# Patient Record
Sex: Female | Born: 1975 | Race: White | Hispanic: No | Marital: Married | Smoking: Never smoker
Health system: Southern US, Community
[De-identification: ages and names within clinical notes are randomized; demographics above are authoritative.]

## PROBLEM LIST (undated history)

## (undated) DIAGNOSIS — F329 Major depressive disorder, single episode, unspecified: Secondary | ICD-10-CM

## (undated) DIAGNOSIS — F32A Depression, unspecified: Secondary | ICD-10-CM

---

## 2013-03-19 ENCOUNTER — Encounter (HOSPITAL_COMMUNITY): Payer: Self-pay | Admitting: Emergency Medicine

## 2013-03-19 ENCOUNTER — Emergency Department (HOSPITAL_COMMUNITY): Payer: BC Managed Care – PPO

## 2013-03-19 ENCOUNTER — Emergency Department (HOSPITAL_COMMUNITY)
Admission: EM | Admit: 2013-03-19 | Discharge: 2013-03-19 | Disposition: A | Discharge status: Home / Self Care | Payer: BC Managed Care – PPO | Attending: Emergency Medicine | Admitting: Emergency Medicine

## 2013-03-19 DIAGNOSIS — S1093XA Contusion of unspecified part of neck, initial encounter: Principal | ICD-10-CM

## 2013-03-19 DIAGNOSIS — S0993XA Unspecified injury of face, initial encounter: Secondary | ICD-10-CM | POA: Insufficient documentation

## 2013-03-19 DIAGNOSIS — F32A Depression, unspecified: Secondary | ICD-10-CM | POA: Insufficient documentation

## 2013-03-19 DIAGNOSIS — W1809XA Striking against other object with subsequent fall, initial encounter: Secondary | ICD-10-CM | POA: Insufficient documentation

## 2013-03-19 DIAGNOSIS — Y929 Unspecified place or not applicable: Secondary | ICD-10-CM | POA: Insufficient documentation

## 2013-03-19 DIAGNOSIS — S199XXA Unspecified injury of neck, initial encounter: Secondary | ICD-10-CM

## 2013-03-19 DIAGNOSIS — W108XXA Fall (on) (from) other stairs and steps, initial encounter: Secondary | ICD-10-CM | POA: Insufficient documentation

## 2013-03-19 DIAGNOSIS — IMO0002 Reserved for concepts with insufficient information to code with codable children: Secondary | ICD-10-CM | POA: Insufficient documentation

## 2013-03-19 DIAGNOSIS — S99929A Unspecified injury of unspecified foot, initial encounter: Secondary | ICD-10-CM

## 2013-03-19 DIAGNOSIS — M542 Cervicalgia: Secondary | ICD-10-CM

## 2013-03-19 DIAGNOSIS — R110 Nausea: Secondary | ICD-10-CM | POA: Insufficient documentation

## 2013-03-19 DIAGNOSIS — S8990XA Unspecified injury of unspecified lower leg, initial encounter: Secondary | ICD-10-CM | POA: Insufficient documentation

## 2013-03-19 DIAGNOSIS — M25561 Pain in right knee: Secondary | ICD-10-CM

## 2013-03-19 DIAGNOSIS — S0083XA Contusion of other part of head, initial encounter: Principal | ICD-10-CM

## 2013-03-19 DIAGNOSIS — W19XXXA Unspecified fall, initial encounter: Secondary | ICD-10-CM

## 2013-03-19 DIAGNOSIS — T148XXA Other injury of unspecified body region, initial encounter: Secondary | ICD-10-CM

## 2013-03-19 DIAGNOSIS — Y9389 Activity, other specified: Secondary | ICD-10-CM | POA: Insufficient documentation

## 2013-03-19 DIAGNOSIS — S0003XA Contusion of scalp, initial encounter: Secondary | ICD-10-CM | POA: Insufficient documentation

## 2013-03-19 DIAGNOSIS — S60229A Contusion of unspecified hand, initial encounter: Secondary | ICD-10-CM | POA: Insufficient documentation

## 2013-03-19 DIAGNOSIS — Z8659 Personal history of other mental and behavioral disorders: Secondary | ICD-10-CM | POA: Insufficient documentation

## 2013-03-19 DIAGNOSIS — S99919A Unspecified injury of unspecified ankle, initial encounter: Secondary | ICD-10-CM

## 2013-03-19 DIAGNOSIS — R269 Unspecified abnormalities of gait and mobility: Secondary | ICD-10-CM | POA: Insufficient documentation

## 2013-03-19 DIAGNOSIS — F329 Major depressive disorder, single episode, unspecified: Secondary | ICD-10-CM | POA: Insufficient documentation

## 2013-03-19 HISTORY — DX: Major depressive disorder, single episode, unspecified: F32.9

## 2013-03-19 HISTORY — DX: Depression, unspecified: F32.A

## 2013-03-19 MED ORDER — ONDANSETRON HCL 4 MG PO TABS: 4.0000 mg | ORAL_TABLET | Freq: Four times a day (QID) | ORAL | Status: AC

## 2013-03-19 MED ORDER — ONDANSETRON 4 MG PO TBDP
8.0000 mg | ORAL_TABLET | Freq: Once | ORAL | Status: AC
  Administered 2013-03-19: 8 mg via ORAL
  Filled 2013-03-19: qty 2

## 2013-03-19 MED ORDER — OXYCODONE-ACETAMINOPHEN 5-325 MG PO TABS
2.0000 | ORAL_TABLET | Freq: Once | ORAL | Status: AC
  Administered 2013-03-19: 2 via ORAL
  Filled 2013-03-19: qty 2

## 2013-03-19 MED ORDER — OXYCODONE-ACETAMINOPHEN 5-325 MG PO TABS: 1.0000 | ORAL_TABLET | Freq: Four times a day (QID) | ORAL | Status: AC | PRN

## 2013-03-19 NOTE — ED Notes (Signed)
Was going down basement steps 7-8  this am and hit head denies  Loc  Or dizziness has a red  Bump on forehead c/o neck and shoulder pain and rt knee pain

## 2013-03-19 NOTE — ED Provider Notes (Signed)
CSN: [id number]     Arrival date & time 03/19/13  4098 History   First MD Initiated Contact with Patient 03/19/13 1003     Chief Complaint  Patient presents with  . Fall   (Consider location/radiation/quality/duration/timing/severity/associated sxs/prior Treatment) HPI Comments: Patient is a 38 year old female who presents after a fall. She reports she was going down the basement steps carrying a blanket in high heels. The high heel got caught in the blanket. This caused her to fall down approximately 6 steps. She hit her head, neck, hands, and knee. She is currently have severe pain in her neck. The pain goes between dull and sharp. The pain is worse with movement and improved with laying still. She has an abrasion to her head, but denies headache, photophobia, visual disturbance. She does has associated nausea. She reports her wrists and hands are sore bilaterally. She has right knee pain. She has been able to ambulate, but requires help from her husband. She was feeling well before the fall with the exception of some nasal congestion. Tetanus shot is UTD within the past 5 years.   The history is provided by the patient. No language interpreter was used.    Past Medical History  Diagnosis Date  . Depression    No past surgical history on file. No family history on file. History  Substance Use Topics  . Smoking status: Never Smoker   . Smokeless tobacco: Not on file  . Alcohol Use: Yes   OB History   Grav Para Term Preterm Abortions TAB SAB Ect Mult Living                 Review of Systems  Constitutional: Negative for fever and chills.  Eyes: Negative for photophobia and visual disturbance.  Respiratory: Negative for shortness of breath.   Cardiovascular: Negative for chest pain.  Gastrointestinal: Positive for nausea. Negative for vomiting and abdominal pain.  Musculoskeletal: Positive for arthralgias, back pain, gait problem, joint swelling, myalgias and neck pain.  Skin:  Positive for wound.  Neurological: Positive for headaches.  All other systems reviewed and are negative.    Allergies  Review of patient's allergies indicates no known allergies.  Home Medications  No current outpatient prescriptions on file. BP 135/80  Pulse 70  Temp(Src) 97.5 F (36.4 C) (Oral)  Resp 18  SpO2 100% Physical Exam  Nursing note and vitals reviewed. Constitutional: She is oriented to person, place, and time. She appears well-developed and well-nourished. No distress.  HENT:  Head: Normocephalic.    Right Ear: External ear normal.  Left Ear: External ear normal.  Nose: Nose normal.  Mouth/Throat: Oropharynx is clear and moist.  5 cm hematoma with abrasion.   Eyes: Conjunctivae, EOM and lids are normal. Pupils are equal, round, and reactive to light.  Neck: Normal range of motion.  Cardiovascular: Normal rate, regular rhythm and normal heart sounds.   Pulmonary/Chest: Effort normal and breath sounds normal. No stridor. No respiratory distress. She has no wheezes. She has no rales.  Abdominal: Soft. She exhibits no distension.  Musculoskeletal: Normal range of motion.  TTP over right wrist diffusely. No snuff box tenderness bilaterally.  Bruising over distal aspect of fingers on left hand.  Neurovascularly intact. Compartment soft.  TTP over right knee. Joint stable. No apprehension sign.   Neurological: She is alert and oriented to person, place, and time. She has normal strength. No sensory deficit. Gait (antalgic) abnormal. Coordination normal.  Grip strength 5/5 bilaterally Finger  nose finger normal  Skin: Skin is warm and dry. She is not diaphoretic. No erythema.  Psychiatric: She has a normal mood and affect. Her behavior is normal.    ED Course  Procedures (including critical care time) Labs Review Labs Reviewed - No data to display Imaging Review Dg Wrist Complete Right  03/19/2013   CLINICAL DATA:  Trauma and pain.  EXAM: RIGHT WRIST - COMPLETE  3+ VIEW  COMPARISON:  None.  FINDINGS: No acute fracture or dislocation. Scaphoid intact.  IMPRESSION: No acute osseous abnormality.   Electronically Signed   By: Jeronimo Greaves M.D.   On: 03/19/2013 11:08   Ct Head Wo Contrast  03/19/2013   CLINICAL DATA:  Trauma.  Numbness 2 right-sided face.  Neck pain.  EXAM: CT HEAD WITHOUT CONTRAST  CT CERVICAL SPINE WITHOUT CONTRAST  TECHNIQUE: Multidetector CT imaging of the head and cervical spine was performed following the standard protocol without intravenous contrast. Multiplanar CT image reconstructions of the cervical spine were also gen Prevertebral soft tissues are within normal limits. No apical pneumothorax.  COMPARISON:  None.  FINDINGS: CT HEAD FINDINGS  Sinuses/Soft tissues: Right frontal scalp soft tissue swelling on image 20/series 3.  Fluid level in the right maxillary sinus. No underlying skull fracture. Clear mastoid air cells.  Intracranial: No mass lesion, hemorrhage, hydrocephalus, acute infarct, intra-axial, or extra-axial fluid collection.  CT CERVICAL SPINE FINDINGS  Spinal visualization through the bottom of C7 to mid T1 level. From C7 inferiorly are degraded secondary to overlying soft tissues. Prevertebral soft tissues are within normal limits. No apical pneumothorax. Maintenance of vertebral body height. Facets are well-aligned. Straightening of expected lordosis. Coronal reformats demonstrate a normal C1-C2 articulation.  IMPRESSION: CT HEAD IMPRESSION  1. Right frontal scalp soft tissue swelling. 2.  No acute intracranial abnormality. 3. Right maxillary sinus fluid. Presumably related to sinusitis. If there was facial trauma consider dedicated face CT to exclude fracture.  CT CERVICAL SPINE IMPRESSION  1. No acute fracture or subluxation. 2. Straightening of expected cervical lordosis could be positional, due to muscular spasm, or ligamentous injury.   Electronically Signed   By: Jeronimo Greaves M.D.   On: 03/19/2013 11:28   Ct Cervical Spine Wo  Contrast  03/19/2013   CLINICAL DATA:  Trauma.  Numbness 2 right-sided face.  Neck pain.  EXAM: CT HEAD WITHOUT CONTRAST  CT CERVICAL SPINE WITHOUT CONTRAST  TECHNIQUE: Multidetector CT imaging of the head and cervical spine was performed following the standard protocol without intravenous contrast. Multiplanar CT image reconstructions of the cervical spine were also gen Prevertebral soft tissues are within normal limits. No apical pneumothorax.  COMPARISON:  None.  FINDINGS: CT HEAD FINDINGS  Sinuses/Soft tissues: Right frontal scalp soft tissue swelling on image 20/series 3.  Fluid level in the right maxillary sinus. No underlying skull fracture. Clear mastoid air cells.  Intracranial: No mass lesion, hemorrhage, hydrocephalus, acute infarct, intra-axial, or extra-axial fluid collection.  CT CERVICAL SPINE FINDINGS  Spinal visualization through the bottom of C7 to mid T1 level. From C7 inferiorly are degraded secondary to overlying soft tissues. Prevertebral soft tissues are within normal limits. No apical pneumothorax. Maintenance of vertebral body height. Facets are well-aligned. Straightening of expected lordosis. Coronal reformats demonstrate a normal C1-C2 articulation.  IMPRESSION: CT HEAD IMPRESSION  1. Right frontal scalp soft tissue swelling. 2.  No acute intracranial abnormality. 3. Right maxillary sinus fluid. Presumably related to sinusitis. If there was facial trauma consider dedicated face CT to exclude  fracture.  CT CERVICAL SPINE IMPRESSION  1. No acute fracture or subluxation. 2. Straightening of expected cervical lordosis could be positional, due to muscular spasm, or ligamentous injury.   Electronically Signed   By: Jeronimo GreavesKyle  Talbot M.D.   On: 03/19/2013 11:28   Dg Knee Complete 4 Views Right  03/19/2013   CLINICAL DATA:  Fall down steps this morning with anterior pain.  EXAM: RIGHT KNEE - COMPLETE 4+ VIEW  COMPARISON:  None.  FINDINGS: No acute fracture or dislocation. No definite joint effusion.  Joint spaces maintained.  IMPRESSION: No acute osseous abnormality.   Electronically Signed   By: Jeronimo GreavesKyle  Talbot M.D.   On: 03/19/2013 11:12   Dg Hand Complete Left  03/19/2013   CLINICAL DATA:  Fall with wrist and palm pain.  EXAM: LEFT HAND - COMPLETE 3+ VIEW  COMPARISON:  None.  FINDINGS: No acute fracture or dislocation.  No definite soft tissue swelling.  IMPRESSION: No acute osseous abnormality.   Electronically Signed   By: Jeronimo GreavesKyle  Talbot M.D.   On: 03/19/2013 11:06    EKG Interpretation   None       MDM   1. Fall, initial encounter   2. Contusion   3. Right knee pain   4. Neck pain    Patient presents to ED after a fall down stairs. Patient is feeling improved after percocet in ED. All imaging shows no acute fracture or pathology. I discussed with patient it is possible she has a concussion and the dangers of re injuring her head. She understands to return to activity in a step wise pattern. Neuro exam is WNL. She was given crutches and a knee immobilizer for comfort. Compartment soft. Neurovascularly intact. I gave the patient strict return instructions. Vital signs stable for discharge. Patient / Family / Caregiver informed of clinical course, understand medical decision-making process, and agree with plan.  Mora BellmanHannah S Quinley Nesler, PA-C 03/19/13 1344

## 2013-03-19 NOTE — ED Notes (Signed)
Patient transported to X-ray 

## 2013-03-19 NOTE — Discharge Instructions (Signed)
Abrasion An abrasion is a cut or scrape of the skin. Abrasions do not extend through all layers of the skin and most heal within 10 days. It is important to care for your abrasion properly to prevent infection. CAUSES  Most abrasions are caused by falling on, or gliding across, the ground or other surface. When your skin rubs on something, the outer and inner layer of skin rubs off, causing an abrasion. DIAGNOSIS  Your caregiver will be able to diagnose an abrasion during a physical exam.  TREATMENT  Your treatment depends on how large and deep the abrasion is. Generally, your abrasion will be cleaned with water and a mild soap to remove any dirt or debris. An antibiotic ointment may be put over the abrasion to prevent an infection. A bandage (dressing) may be wrapped around the abrasion to keep it from getting dirty.  You may need a tetanus shot if:  You cannot remember when you had your last tetanus shot.  You have never had a tetanus shot.  The injury broke your skin. If you get a tetanus shot, your arm may swell, get red, and feel warm to the touch. This is common and not a problem. If you need a tetanus shot and you choose not to have one, there is a rare chance of getting tetanus. Sickness from tetanus can be serious.  HOME CARE INSTRUCTIONS   If a dressing was applied, change it at least once a day or as directed by your caregiver. If the bandage sticks, soak it off with warm water.   Wash the area with water and a mild soap to remove all the ointment 2 times a day. Rinse off the soap and pat the area dry with a clean towel.   Reapply any ointment as directed by your caregiver. This will help prevent infection and keep the bandage from sticking. Use gauze over the wound and under the dressing to help keep the bandage from sticking.   Change your dressing right away if it becomes wet or dirty.   Only take over-the-counter or prescription medicines for pain, discomfort, or fever as  directed by your caregiver.   Follow up with your caregiver within 24 48 hours for a wound check, or as directed. If you were not given a wound-check appointment, look closely at your abrasion for redness, swelling, or pus. These are signs of infection. SEEK IMMEDIATE MEDICAL CARE IF:   You have increasing pain in the wound.   You have redness, swelling, or tenderness around the wound.   You have pus coming from the wound.   You have a fever or persistent symptoms for more than 2 3 days.  You have a fever and your symptoms suddenly get worse.  You have a bad smell coming from the wound or dressing.  MAKE SURE YOU:   Understand these instructions.  Will watch your condition.  Will get help right away if you are not doing well or get worse. Document Released: 12/06/2004 Document Revised: 02/13/2012 Document Reviewed: 01/30/2011 [facility] Patient Information 2014 [place], [place].  Arthralgia Arthralgia is joint pain. A joint is a place where two bones meet. Joint pain can happen for many reasons. The joint can be bruised, stiff, infected, or weak from aging. Pain usually goes away after resting and taking medicine for soreness.  HOME CARE  Rest the joint as told by your doctor.  Keep the sore joint raised (elevated) for the first 24 hours.  Put ice on the  joint area.  Put ice in a plastic bag.  Place a towel between your skin and the bag.  Leave the ice on for 15-20 minutes, 03-04 times a day.  Wear your splint, casting, elastic bandage, or sling as told by your doctor.  Only take medicine as told by your doctor. Do not take aspirin.  Use crutches as told by your doctor. Do not put weight on the joint until told to by your doctor. GET HELP RIGHT AWAY IF:   You have bruising, puffiness (swelling), or more pain.  Your fingers or toes turn blue or start to lose feeling (numb).  Your medicine does not lessen the pain.  Your pain becomes severe.  You have a  temperature by mouth above 102 F (38.9 C), not controlled by medicine.  You cannot move or use the joint. MAKE SURE YOU:   Understand these instructions.  Will watch your condition.  Will get help right away if you are not doing well or get worse. Document Released: 02/14/2009 Document Revised: 05/21/2011 Document Reviewed: 02/14/2009 [facility] Patient Information 2014 [place], [place].  Cervical Strain and Sprain (Whiplash) with Rehab Cervical strain and sprains are injuries that commonly occur with "whiplash" injuries. Whiplash occurs when the neck is forcefully whipped backward or forward, such as during a motor vehicle accident. The muscles, ligaments, tendons, discs and nerves of the neck are susceptible to injury when this occurs. SYMPTOMS   Pain or stiffness in the front and/or back of neck  Symptoms may present immediately or up to 24 hours after injury.  Dizziness, headache, nausea and vomiting.  Muscle spasm with soreness and stiffness in the neck.  Tenderness and swelling at the injury site. CAUSES  Whiplash injuries often occur during contact sports or motor vehicle accidents.  RISK INCREASES WITH:  Osteoarthritis of the spine.  Situations that make head or neck accidents or trauma more likely.  High-risk sports (football, rugby, wrestling, hockey, auto racing, gymnastics, diving, contact karate or boxing).  Poor strength and flexibility of the neck.  Previous neck injury.  Poor tackling technique.  Improperly fitted or padded equipment. PREVENTION  Learn and use proper technique (avoid tackling with the head, spearing and head-butting; use proper falling techniques to avoid landing on the head).  Warm up and stretch properly before activity.  Maintain physical fitness:  Strength, flexibility and endurance.  Cardiovascular fitness.  Wear properly fitted and padded protective equipment, such as padded soft collars, for participation in contact  sports. PROGNOSIS  Recovery for cervical strain and sprain injuries is dependent on the extent of the injury. These injuries are usually curable in 1 week to 3 months with appropriate treatment.  RELATED COMPLICATIONS   Temporary numbness and weakness may occur if the nerve roots are damaged, and this may persist until the nerve has completely healed.  Chronic pain due to frequent recurrence of symptoms.  Prolonged healing, especially if activity is resumed too soon (before complete recovery). TREATMENT  Treatment initially involves the use of ice and medication to help reduce pain and inflammation. It is also important to perform strengthening and stretching exercises and modify activities that worsen symptoms so the injury does not get worse. These exercises may be performed at home or with a therapist. For patients who experience severe symptoms, a soft padded collar may be recommended to be worn around the neck.  Improving your posture may help reduce symptoms. Posture improvement includes pulling your chin and abdomen in while sitting or standing. If you  are sitting, sit in a firm chair with your buttocks against the back of the chair. While sleeping, try replacing your pillow with a small towel rolled to 2 inches in diameter, or use a cervical pillow or soft cervical collar. Poor sleeping positions delay healing.  For patients with nerve root damage, which causes numbness or weakness, the use of a cervical traction apparatus may be recommended. Surgery is rarely necessary for these injuries. However, cervical strain and sprains that are present at birth (congenital) may require surgery. MEDICATION   If pain medication is necessary, nonsteroidal anti-inflammatory medications, such as aspirin and ibuprofen, or other minor pain relievers, such as acetaminophen, are often recommended.  Do not take pain medication for 7 days before surgery.  Prescription pain relievers may be given if deemed  necessary by your caregiver. Use only as directed and only as much as you need. HEAT AND COLD:   Cold treatment (icing) relieves pain and reduces inflammation. Cold treatment should be applied for 10 to 15 minutes every 2 to 3 hours for inflammation and pain and immediately after any activity that aggravates your symptoms. Use ice packs or an ice massage.  Heat treatment may be used prior to performing the stretching and strengthening activities prescribed by your caregiver, physical therapist, or athletic trainer. Use a heat pack or a warm soak. SEEK MEDICAL CARE IF:   Symptoms get worse or do not improve in 2 weeks despite treatment.  New, unexplained symptoms develop (drugs used in treatment may produce side effects). EXERCISES RANGE OF MOTION (ROM) AND STRETCHING EXERCISES - Cervical Strain and Sprain These exercises may help you when beginning to rehabilitate your injury. In order to successfully resolve your symptoms, you must improve your posture. These exercises are designed to help reduce the forward-head and rounded-shoulder posture which contributes to this condition. Your symptoms may resolve with or without further involvement from your physician, physical therapist or athletic trainer. While completing these exercises, remember:   Restoring tissue flexibility helps normal motion to return to the joints. This allows healthier, less painful movement and activity.  An effective stretch should be held for at least 20 seconds, although you may need to begin with shorter hold times for comfort.  A stretch should never be painful. You should only feel a gentle lengthening or release in the stretched tissue. STRETCH- Axial Extensors  Lie on your back on the floor. You may bend your knees for comfort. Place a rolled up hand towel or dish towel, about 2 inches in diameter, under the part of your head that makes contact with the floor.  Gently tuck your chin, as if trying to make a "double  chin," until you feel a gentle stretch at the base of your head.  Hold __________ seconds. Repeat __________ times. Complete this exercise __________ times per day.  STRETECH - Axial Extension   Stand or sit on a firm surface. Assume a good posture: chest up, shoulders drawn back, abdominal muscles slightly tense, knees unlocked (if standing) and feet hip width apart.  Slowly retract your chin so your head slides back and your chin slightly lowers.Continue to look straight ahead.  You should feel a gentle stretch in the back of your head. Be certain not to feel an aggressive stretch since this can cause headaches later.  Hold for __________ seconds. Repeat __________ times. Complete this exercise __________ times per day. STRETCH  Cervical Side Bend   Stand or sit on a firm surface. Assume a good  posture: chest up, shoulders drawn back, abdominal muscles slightly tense, knees unlocked (if standing) and feet hip width apart.  Without letting your nose or shoulders move, slowly tip your right / left ear to your shoulder until your feel a gentle stretch in the muscles on the opposite side of your neck.  Hold __________ seconds. Repeat __________ times. Complete this exercise __________ times per day. STRETCH  Cervical Rotators   Stand or sit on a firm surface. Assume a good posture: chest up, shoulders drawn back, abdominal muscles slightly tense, knees unlocked (if standing) and feet hip width apart.  Keeping your eyes level with the ground, slowly turn your head until you feel a gentle stretch along the back and opposite side of your neck.  Hold __________ seconds. Repeat __________ times. Complete this exercise __________ times per day. RANGE OF MOTION - Neck Circles   Stand or sit on a firm surface. Assume a good posture: chest up, shoulders drawn back, abdominal muscles slightly tense, knees unlocked (if standing) and feet hip width apart.  Gently roll your head down and around  from the back of one shoulder to the back of the other. The motion should never be forced or painful.  Repeat the motion 10-20 times, or until you feel the neck muscles relax and loosen. Repeat __________ times. Complete the exercise __________ times per day. STRENGTHENING EXERCISES - Cervical Strain and Sprain These exercises may help you when beginning to rehabilitate your injury. They may resolve your symptoms with or without further involvement from your physician, physical therapist or athletic trainer. While completing these exercises, remember:   Muscles can gain both the endurance and the strength needed for everyday activities through controlled exercises.  Complete these exercises as instructed by your physician, physical therapist or athletic trainer. Progress the resistance and repetitions only as guided.  You may experience muscle soreness or fatigue, but the pain or discomfort you are trying to eliminate should never worsen during these exercises. If this pain does worsen, stop and make certain you are following the directions exactly. If the pain is still present after adjustments, discontinue the exercise until you can discuss the trouble with your clinician. STRENGTH Cervical Flexors, Isometric  Face a wall, standing about 6 inches away. Place a small pillow, a ball about 6-8 inches in diameter, or a folded towel between your forehead and the wall.  Slightly tuck your chin and gently push your forehead into the soft object. Push only with mild to moderate intensity, building up tension gradually. Keep your jaw and forehead relaxed.  Hold 10 to 20 seconds. Keep your breathing relaxed.  Release the tension slowly. Relax your neck muscles completely before you start the next repetition. Repeat __________ times. Complete this exercise __________ times per day. STRENGTH- Cervical Lateral Flexors, Isometric   Stand about 6 inches away from a wall. Place a small pillow, a ball about  6-8 inches in diameter, or a folded towel between the side of your head and the wall.  Slightly tuck your chin and gently tilt your head into the soft object. Push only with mild to moderate intensity, building up tension gradually. Keep your jaw and forehead relaxed.  Hold 10 to 20 seconds. Keep your breathing relaxed.  Release the tension slowly. Relax your neck muscles completely before you start the next repetition. Repeat __________ times. Complete this exercise __________ times per day. STRENGTH  Cervical Extensors, Isometric   Stand about 6 inches away from a wall. Place a  small pillow, a ball about 6-8 inches in diameter, or a folded towel between the back of your head and the wall.  Slightly tuck your chin and gently tilt your head back into the soft object. Push only with mild to moderate intensity, building up tension gradually. Keep your jaw and forehead relaxed.  Hold 10 to 20 seconds. Keep your breathing relaxed.  Release the tension slowly. Relax your neck muscles completely before you start the next repetition. Repeat __________ times. Complete this exercise __________ times per day. POSTURE AND BODY MECHANICS CONSIDERATIONS - Cervical Strain and Sprain Keeping correct posture when sitting, standing or completing your activities will reduce the stress put on different body tissues, allowing injured tissues a chance to heal and limiting painful experiences. The following are general guidelines for improved posture. Your physician or physical therapist will provide you with any instructions specific to your needs. While reading these guidelines, remember:  The exercises prescribed by your provider will help you have the flexibility and strength to maintain correct postures.  The correct posture provides the optimal environment for your joints to work. All of your joints have less wear and tear when properly supported by a spine with good posture. This means you will experience a  healthier, less painful body.  Correct posture must be practiced with all of your activities, especially prolonged sitting and standing. Correct posture is as important when doing repetitive low-stress activities (typing) as it is when doing a single heavy-load activity (lifting). PROLONGED STANDING WHILE SLIGHTLY LEANING FORWARD When completing a task that requires you to lean forward while standing in one place for a long time, place either foot up on a stationary 2-4 inch high object to help maintain the best posture. When both feet are on the ground, the low back tends to lose its slight inward curve. If this curve flattens (or becomes too large), then the back and your other joints will experience too much stress, fatigue more quickly and can cause pain.  RESTING POSITIONS Consider which positions are most painful for you when choosing a resting position. If you have pain with flexion-based activities (sitting, bending, stooping, squatting), choose a position that allows you to rest in a less flexed posture. You would want to avoid curling into a fetal position on your side. If your pain worsens with extension-based activities (prolonged standing, working overhead), avoid resting in an extended position such as sleeping on your stomach. Most people will find more comfort when they rest with their spine in a more neutral position, neither too rounded nor too arched. Lying on a non-sagging bed on your side with a pillow between your knees, or on your back with a pillow under your knees will often provide some relief. Keep in mind, being in any one position for a prolonged period of time, no matter how correct your posture, can still lead to stiffness. WALKING Walk with an upright posture. Your ears, shoulders and hips should all line-up. OFFICE WORK When working at a desk, create an environment that supports good, upright posture. Without extra support, muscles fatigue and lead to excessive strain on  joints and other tissues. CHAIR:  A chair should be able to slide under your desk when your back makes contact with the back of the chair. This allows you to work closely.  The chair's height should allow your eyes to be level with the upper part of your monitor and your hands to be slightly lower than your elbows.  Body position:  Your feet should make contact with the floor. If this is not possible, use a foot rest.  Keep your ears over your shoulders. This will reduce stress on your neck and low back. Document Released: 02/26/2005 Document Revised: 06/23/2012 Document Reviewed: 06/10/2008 [facility] Patient Information 2014 [place], [place].

## 2013-03-20 NOTE — ED Provider Notes (Signed)
Medical screening examination/treatment/procedure(s) were performed by non-physician practitioner and as supervising physician I was immediately available for consultation/collaboration.  EKG Interpretation   None          Candiss Galeana E Deshanti Adcox, MD 03/20/13 1014 

## 2014-06-10 IMAGING — CT CT HEAD W/O CM
3 of 5 series · 15 of 47 positions shown, 18 images · non-contrast
Comparison: None.

CLINICAL DATA: Trauma.  Numbness 2 right-sided face.  Neck pain.

EXAM:
CT HEAD WITHOUT CONTRAST
CT CERVICAL SPINE WITHOUT CONTRAST
TECHNIQUE: Multidetector CT imaging of the head and cervical spine was
performed following the standard protocol without intravenous
contrast. Multiplanar CT image reconstructions of the cervical spine
were also gen Prevertebral soft tissues are within normal limits. No
apical pneumothorax.

[Series 7: coronals · coronal · 0.26mm/px · 3 of 39 slices shown]
[im 13/39  brain]
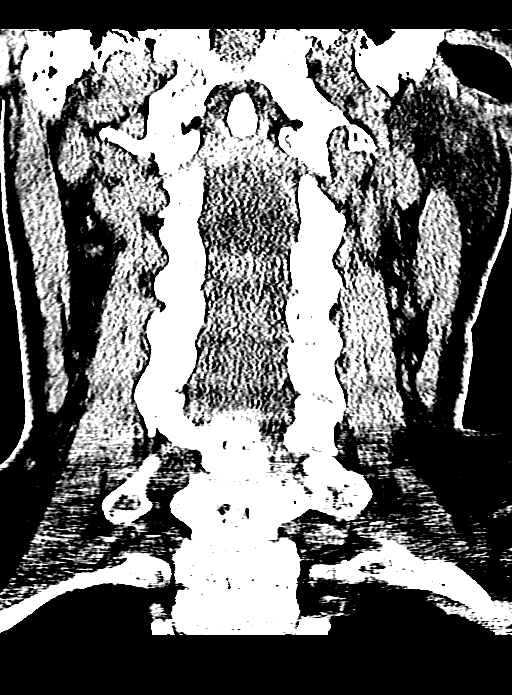
[im 17/39  brain]
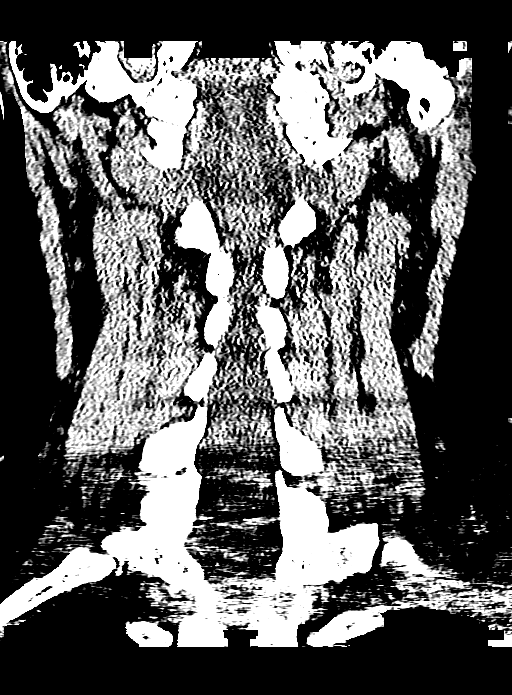
[im 22/39  brain]
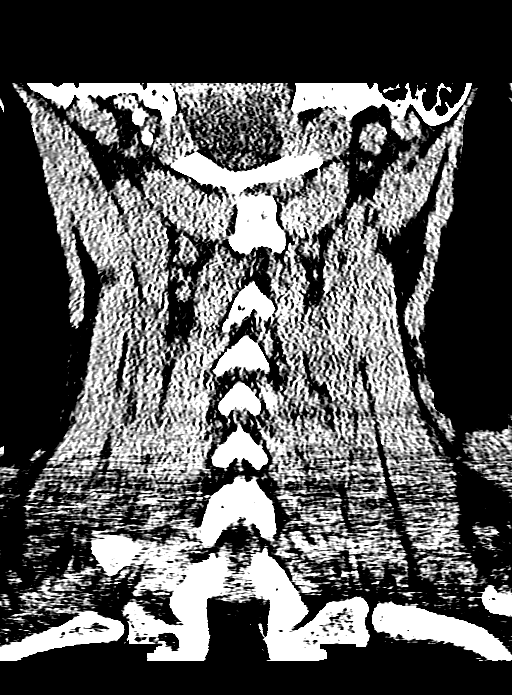

[Series 8: sagittals · sagittal · 0.30mm/px · 3 of 39 slices shown]
[im 13/39  brain]
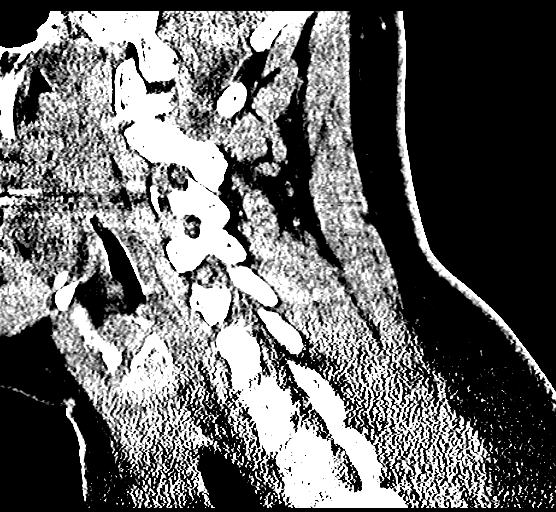
[im 20/39  brain]
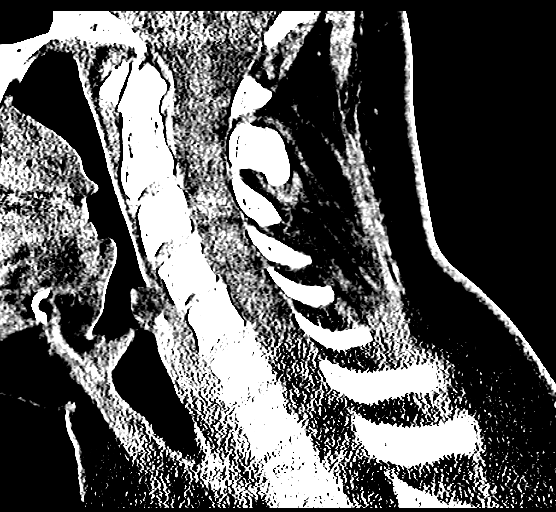
[im 26/39  brain]
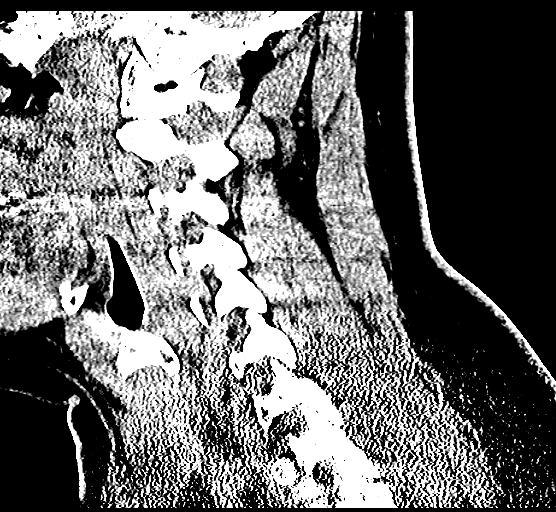

[Series 9: orthogonals · axial · 0.32mm/px · z∈[-339,-202]mm · 9 of 90 slices shown, 12 images]
[im 7/90  brain]
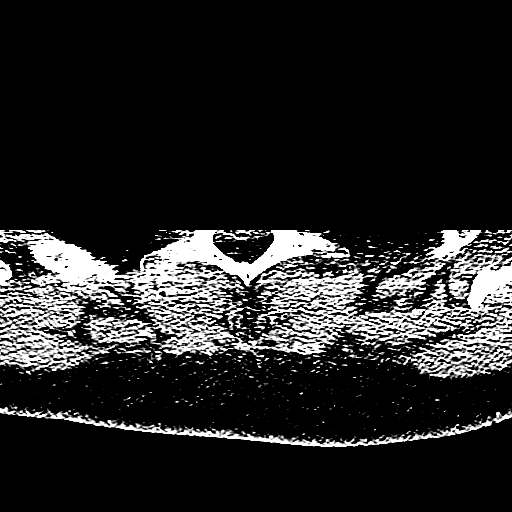
[im 7/90  bone]
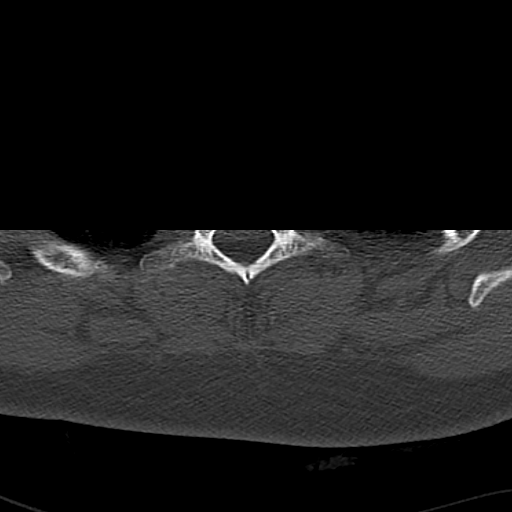
[im 21/90  brain]
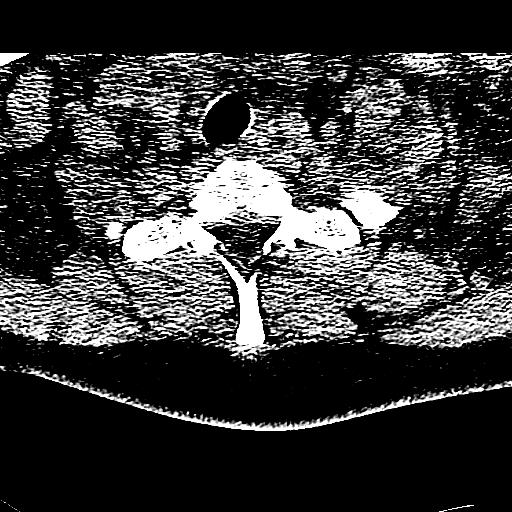
[im 28/90  brain]
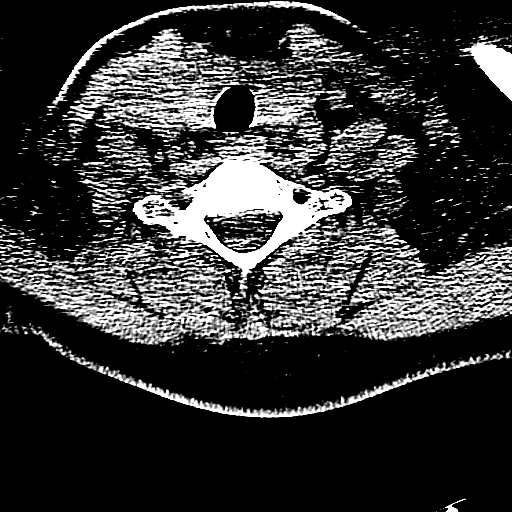
[im 35/90  brain]
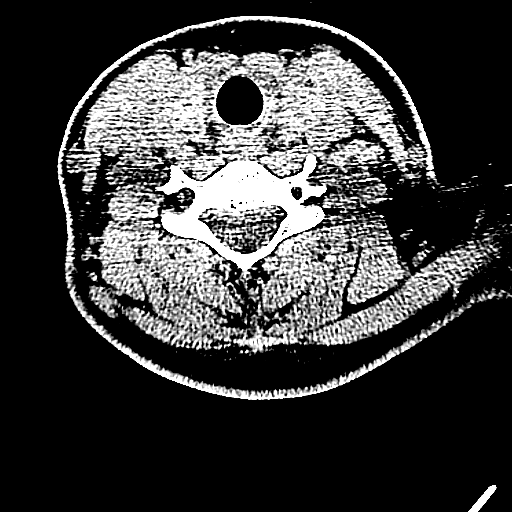
[im 48/90  brain]
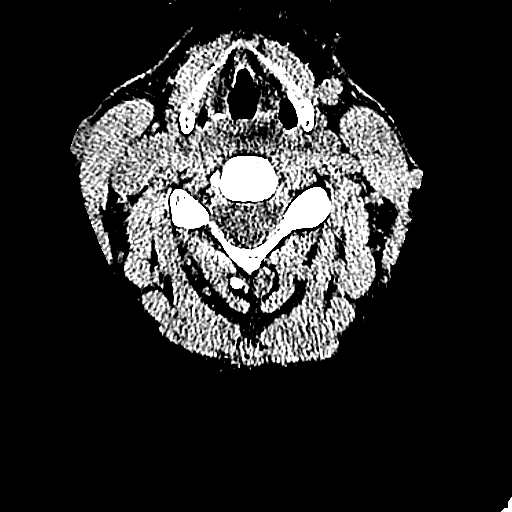
[im 48/90  bone]
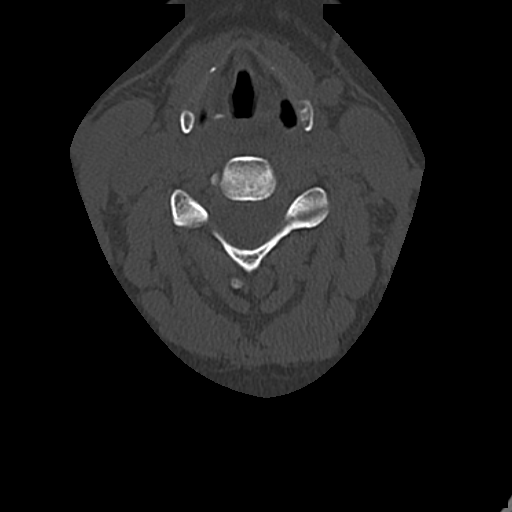
[im 55/90  brain]
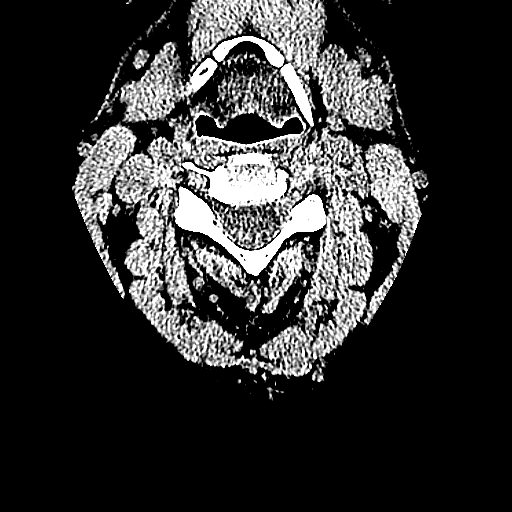
[im 62/90  brain]
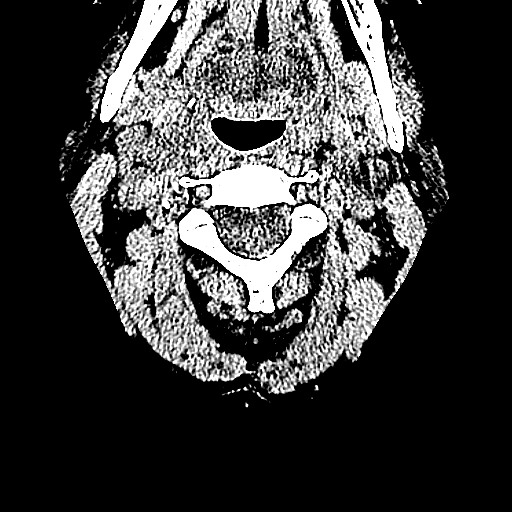
[im 76/90  brain]
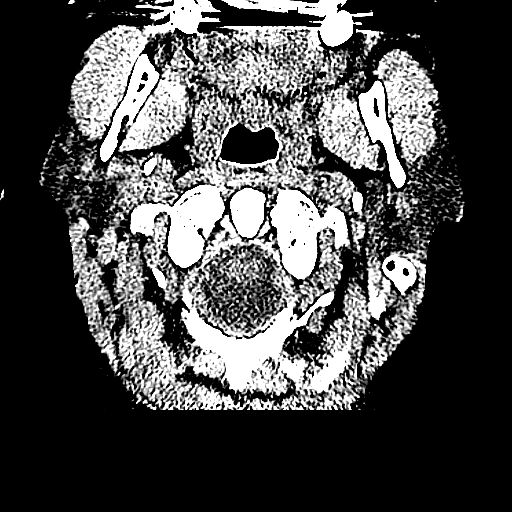
[im 83/90  brain]
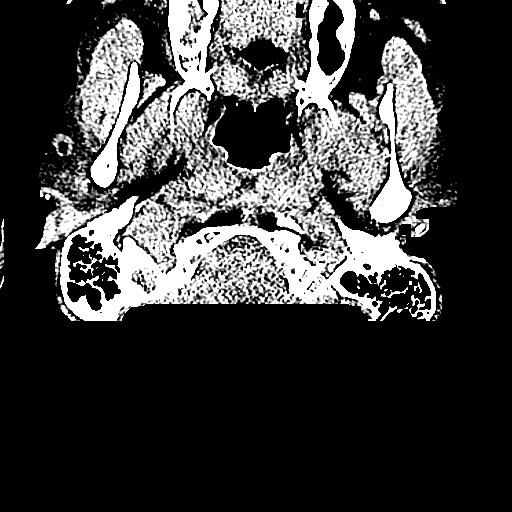
[im 83/90  bone]
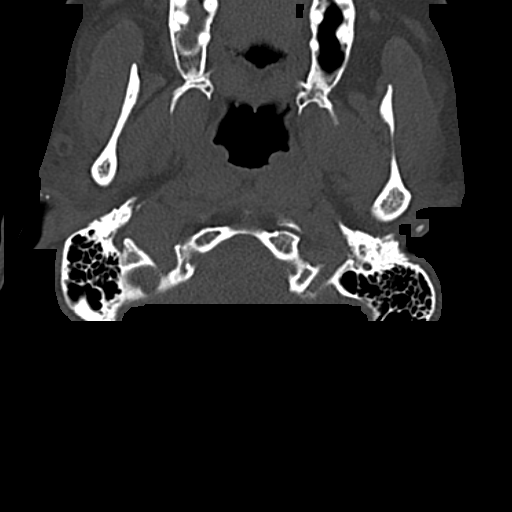

[15 of 47 positions shown; findings below may reference images not displayed]

FINDINGS: CT HEAD FINDINGS

Sinuses/Soft tissues: Right frontal scalp soft tissue swelling on
image 20/series 3.

Fluid level in the right maxillary sinus. No underlying skull
fracture. Clear mastoid air cells.

Intracranial: No mass lesion, hemorrhage, hydrocephalus, acute
infarct, intra-axial, or extra-axial fluid collection.

CT CERVICAL SPINE FINDINGS

Spinal visualization through the bottom of C7 to mid T1 level. From
C7 inferiorly are degraded secondary to overlying soft tissues.
Prevertebral soft tissues are within normal limits. No apical
pneumothorax. Maintenance of vertebral body height. Facets are
well-aligned. Straightening of expected lordosis. Coronal reformats
demonstrate a normal C1-C2 articulation.
IMPRESSION: CT HEAD IMPRESSION

1. Right frontal scalp soft tissue swelling.
2.  No acute intracranial abnormality.
3. Right maxillary sinus fluid. Presumably related to sinusitis. If
there was facial trauma consider dedicated face CT to exclude
fracture.

CT CERVICAL SPINE IMPRESSION

1. No acute fracture or subluxation.
2. Straightening of expected cervical lordosis could be positional,
due to muscular spasm, or ligamentous injury.

## 2014-06-10 IMAGING — CR DG HAND COMPLETE 3+V*L*
3 series · 3 of 3 positions shown · non-contrast
Comparison: None.

CLINICAL DATA: Fall with wrist and palm pain.

EXAM:
LEFT HAND - COMPLETE 3+ VIEW

[x hand pa left]
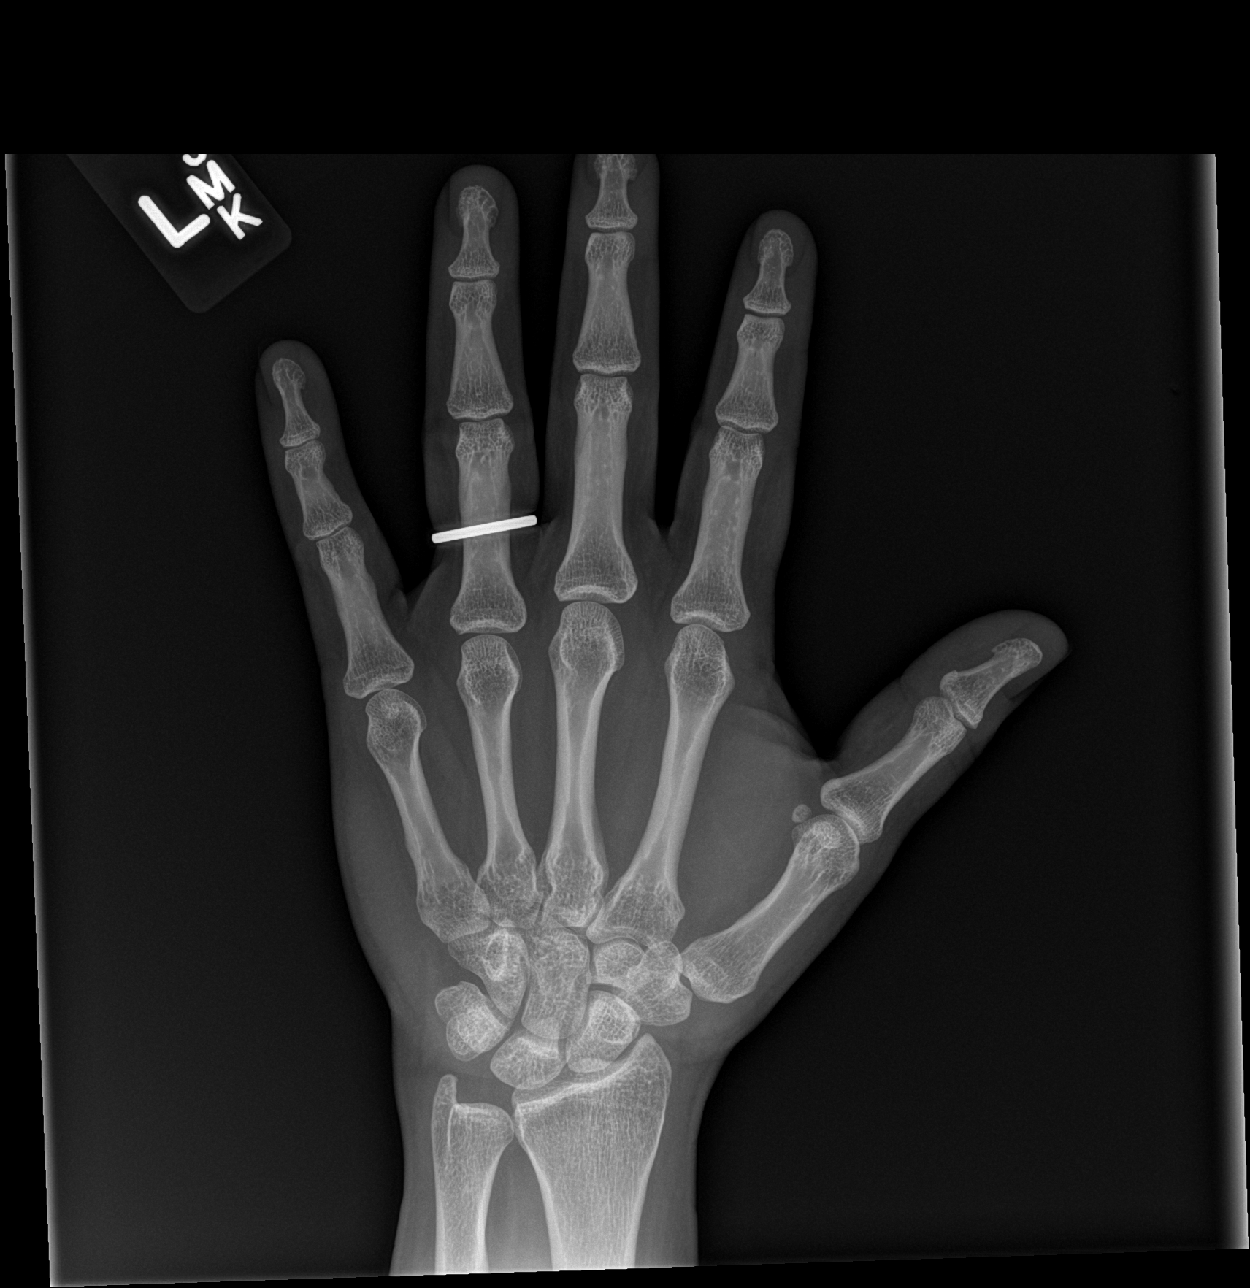

[x hand obl left]
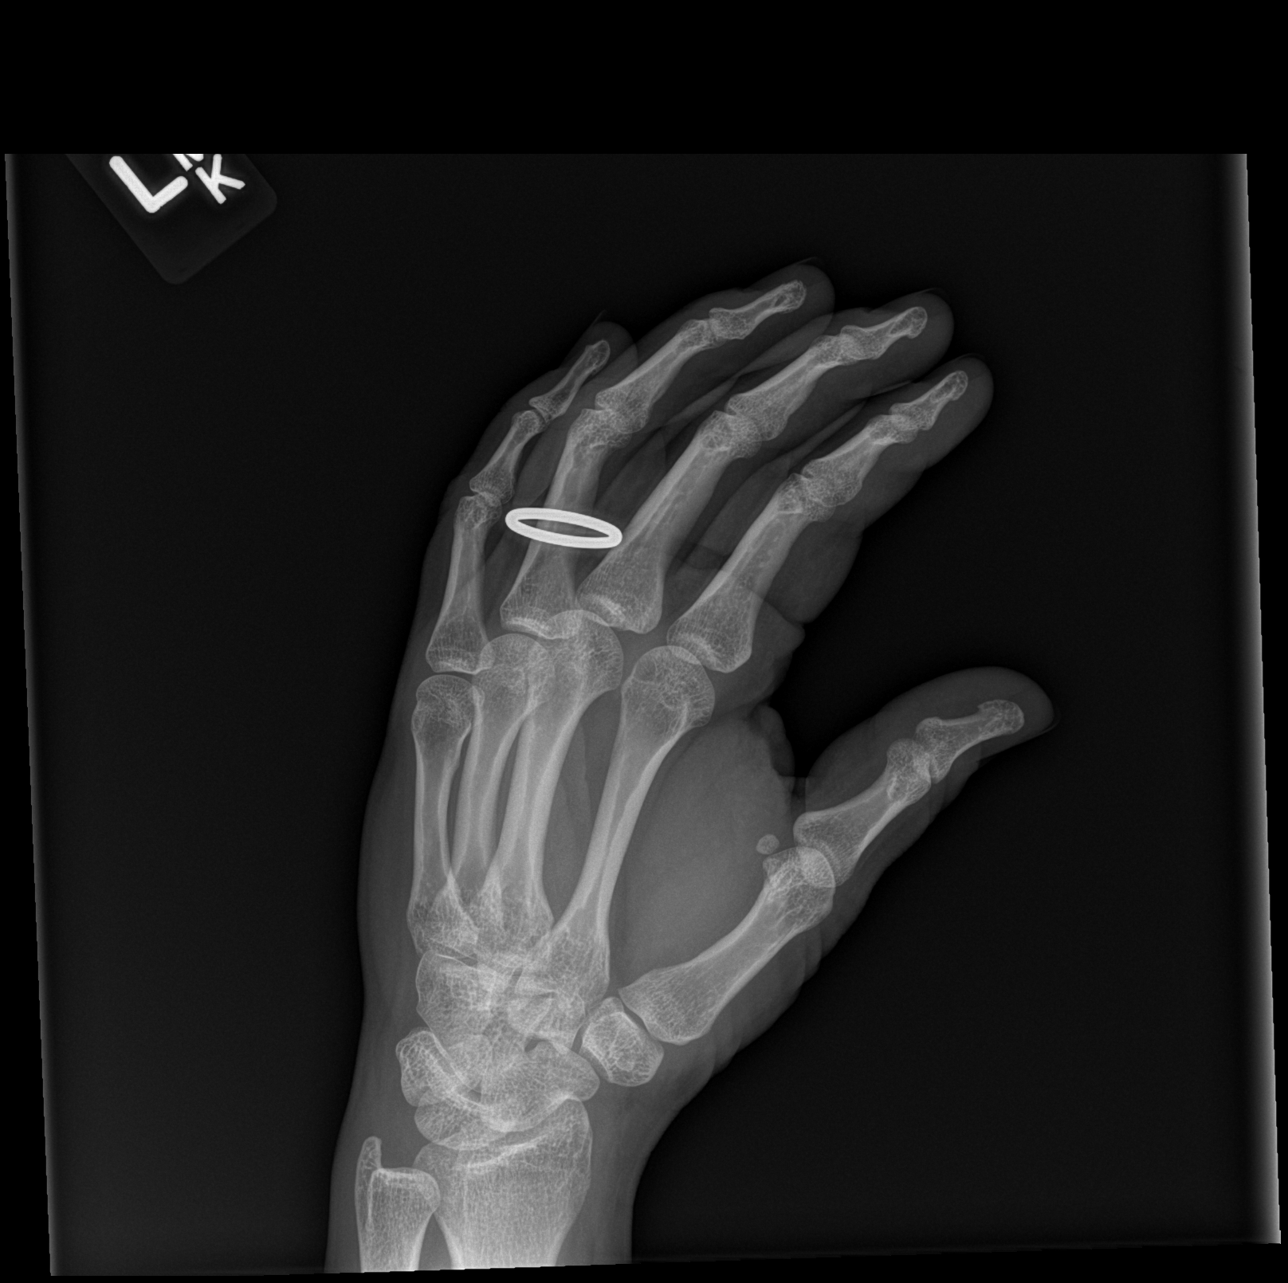

[x hand lat left]
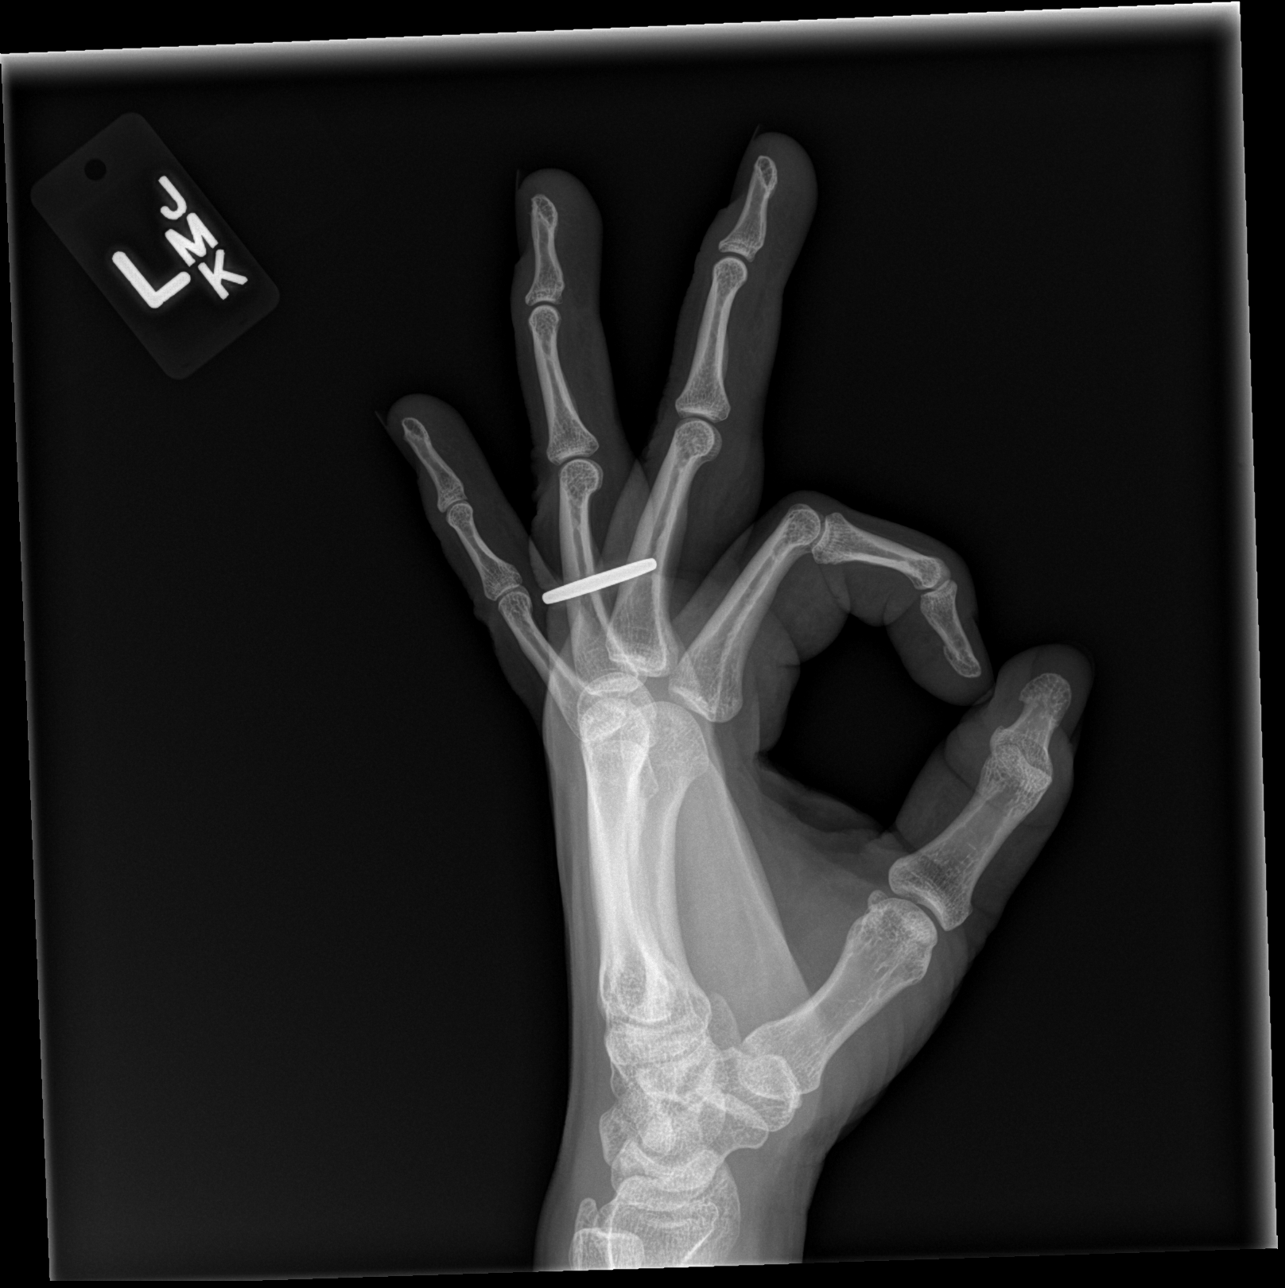

[3 of 3 positions shown; findings below may reference images not displayed]

FINDINGS: No acute fracture or dislocation.  No definite soft tissue swelling.
IMPRESSION: No acute osseous abnormality.
